# Patient Record
Sex: Female | Born: 1975 | Race: White | Hispanic: No | Marital: Married | Smoking: Former smoker
Health system: Southern US, Community
[De-identification: ages and names within clinical notes are randomized; demographics above are authoritative.]

## PROBLEM LIST (undated history)

## (undated) DIAGNOSIS — Z98891 History of uterine scar from previous surgery: Secondary | ICD-10-CM

## (undated) DIAGNOSIS — Z9851 Tubal ligation status: Secondary | ICD-10-CM

## (undated) DIAGNOSIS — Z3480 Encounter for supervision of other normal pregnancy, unspecified trimester: Secondary | ICD-10-CM

## (undated) DIAGNOSIS — E039 Hypothyroidism, unspecified: Secondary | ICD-10-CM

## (undated) HISTORY — PX: DIAGNOSTIC LAPAROSCOPY: SUR761

---

## 2006-11-28 ENCOUNTER — Ambulatory Visit (HOSPITAL_COMMUNITY): Payer: Self-pay | Admitting: Psychiatry

## 2006-12-28 ENCOUNTER — Ambulatory Visit (HOSPITAL_COMMUNITY): Payer: Self-pay | Admitting: Psychiatry

## 2007-02-01 ENCOUNTER — Ambulatory Visit (HOSPITAL_COMMUNITY): Payer: Self-pay | Admitting: Psychiatry

## 2007-04-26 ENCOUNTER — Ambulatory Visit (HOSPITAL_COMMUNITY): Payer: Self-pay | Admitting: Psychiatry

## 2007-07-26 ENCOUNTER — Ambulatory Visit (HOSPITAL_COMMUNITY): Payer: Self-pay | Admitting: Psychiatry

## 2007-08-06 ENCOUNTER — Ambulatory Visit (HOSPITAL_COMMUNITY): Payer: Self-pay | Admitting: Psychiatry

## 2007-09-02 ENCOUNTER — Ambulatory Visit (HOSPITAL_COMMUNITY): Payer: Self-pay | Admitting: Psychiatry

## 2007-09-19 ENCOUNTER — Ambulatory Visit (HOSPITAL_COMMUNITY): Payer: Self-pay | Admitting: Psychiatry

## 2007-10-02 ENCOUNTER — Ambulatory Visit (HOSPITAL_COMMUNITY): Payer: Self-pay | Admitting: Psychiatry

## 2007-11-14 ENCOUNTER — Ambulatory Visit (HOSPITAL_COMMUNITY): Payer: Self-pay | Admitting: Psychiatry

## 2007-11-19 ENCOUNTER — Ambulatory Visit (HOSPITAL_COMMUNITY): Payer: Self-pay | Admitting: Psychiatry

## 2007-12-10 ENCOUNTER — Ambulatory Visit (HOSPITAL_COMMUNITY): Payer: Self-pay | Admitting: Psychiatry

## 2007-12-31 ENCOUNTER — Ambulatory Visit (HOSPITAL_COMMUNITY): Payer: Self-pay | Admitting: Psychology

## 2008-01-15 ENCOUNTER — Ambulatory Visit (HOSPITAL_COMMUNITY): Payer: Self-pay | Admitting: Psychiatry

## 2008-02-25 ENCOUNTER — Ambulatory Visit (HOSPITAL_COMMUNITY): Payer: Self-pay | Admitting: Psychiatry

## 2008-03-26 ENCOUNTER — Ambulatory Visit (HOSPITAL_COMMUNITY): Payer: Self-pay | Admitting: Psychiatry

## 2008-04-24 ENCOUNTER — Ambulatory Visit (HOSPITAL_COMMUNITY): Payer: Self-pay | Admitting: Psychiatry

## 2011-11-07 NOTE — L&D Delivery Note (Signed)
Delivery Note   Requested by Dr. Ellyn Hack to attend this repeat C-section delivery at 39 [redacted] weeks GA.   The mother is a G3P2  O pos, GBS neg.  Pregnancy uncomplicated.  ROM at delivery with clear fluid.   Infant vigorous with good spontaneous cry.  Routine NRP followed including warming, drying and stimulation.  Apgars 9 / 9.  Physical exam within normal limits / notable for .   Left in OR for skin-to-skin contact with mother, in care of CN staff.  John Giovanni, DO  Neonatologist

## 2012-04-09 LAB — OB RESULTS CONSOLE HIV ANTIBODY (ROUTINE TESTING): HIV: NONREACTIVE

## 2012-04-09 LAB — OB RESULTS CONSOLE ABO/RH: RH Type: POSITIVE

## 2012-04-09 LAB — OB RESULTS CONSOLE RPR: RPR: NONREACTIVE

## 2012-04-09 LAB — OB RESULTS CONSOLE HEPATITIS B SURFACE ANTIGEN: Hepatitis B Surface Ag: NEGATIVE

## 2012-10-15 ENCOUNTER — Encounter (HOSPITAL_COMMUNITY): Payer: Self-pay

## 2012-10-17 ENCOUNTER — Encounter (HOSPITAL_COMMUNITY)
Admission: RE | Admit: 2012-10-17 | Discharge: 2012-10-17 | Disposition: A | Discharge status: Home / Self Care | Payer: BC Managed Care – PPO | Source: Ambulatory Visit | Attending: Obstetrics and Gynecology | Admitting: Obstetrics and Gynecology

## 2012-10-17 ENCOUNTER — Encounter (HOSPITAL_COMMUNITY): Payer: Self-pay

## 2012-10-17 HISTORY — DX: Hypothyroidism, unspecified: E03.9

## 2012-10-17 LAB — SURGICAL PCR SCREEN: MRSA, PCR: NEGATIVE

## 2012-10-17 LAB — CBC
HCT: 39.7 % (ref 36.0–46.0)
Hemoglobin: 13 g/dL (ref 12.0–15.0)
MCH: 30.4 pg (ref 26.0–34.0)
MCHC: 32.7 g/dL (ref 30.0–36.0)
RDW: 14.8 % (ref 11.5–15.5)

## 2012-10-17 NOTE — Patient Instructions (Addendum)
[name]  10/17/2012   Your procedure is scheduled on:  10/22/12  Enter through the Main Entrance of [facility] at 6 AM.  Pick up the phone at the desk and dial [phone].   Call this number if you have problems the morning of surgery: [phone]   Remember:   Do not eat food:After Midnight.  Do not drink clear liquids: After Midnight.  Take these medicines the morning of surgery with A SIP OF WATER: Thyroid medication   Do not wear jewelry, make-up or nail polish.  Do not wear lotions, powders, or perfumes. You may wear deodorant.  Do not shave 48 hours prior to surgery.  Do not bring valuables to the hospital.  Contacts, dentures or bridgework may not be worn into surgery.  Leave suitcase in the car. After surgery it may be brought to your room.  For patients admitted to the hospital, checkout time is 11:00 AM the day of discharge.   Patients discharged the day of surgery will not be allowed to drive home.  Name and phone number of your driver: NA  Special Instructions: Shower using CHG 2 nights before surgery and the night before surgery.  If you shower the day of surgery use CHG.  Use special wash - you have one bottle of CHG for all showers.  You should use approximately 1/3 of the bottle for each shower.   Please read over the following fact sheets that you were given: MRSA Information

## 2012-10-21 ENCOUNTER — Encounter (HOSPITAL_COMMUNITY): Payer: Self-pay | Admitting: Pharmacist

## 2012-10-21 NOTE — H&P (Signed)
Bryanna Yim is a 36 y.o. female G3P2002 at 39+ for rLTCS/BTL.  Pt has h/o VBAC and LTCS, desires rLTCS and BTL for undesired future fertility.  +FM< no LOF, no VB, occ ctx.  Uncomplicated PNC.  D/w pt r/b/a of rLTCS and BTL. Maternal Medical History:  Contractions: Frequency: irregular.    Fetal activity: Perceived fetal activity is normal.   Last perceived fetal movement was within the past hour.      OB History    Grav Para Term Preterm Abortions TAB SAB Ect Mult Living   3 2 2       2     G1 LTCS, FTP 9#5 G2 VBAC, Forceps 8#5 G3 present  No STDs Past Medical History  Diagnosis Date  . Hypothyroidism   GAD/depression Past Surgical History  Procedure Date  . Cesarean section   . Diagnostic laparoscopy    Family History: DM, HTN, Kidney disease, nervous d/o Social History:  does not have a smoking history on file. She does not have any smokeless tobacco history on file. Her alcohol and drug histories not on file. married Meds PNV, Synthroid All PCN - SOB   Prenatal Transfer Tool  Maternal Diabetes: No Genetic Screening: Normal Maternal Ultrasounds/Referrals: Normal Fetal Ultrasounds or other Referrals:  None Maternal Substance Abuse:  No Significant Maternal Medications:  None Significant Maternal Lab Results:  Lab values include: Group B Strep negative Other Comments:  None  Review of Systems  Constitutional: Negative.   HENT: Negative.   Eyes: Negative.   Respiratory: Negative.   Cardiovascular: Negative.   Gastrointestinal: Negative.   Genitourinary: Negative.   Musculoskeletal: Negative.   Skin: Negative.   Neurological: Negative.   Psychiatric/Behavioral: Negative.       There were no vitals taken for this visit. Maternal Exam:  Abdomen: Surgical scars: low transverse.   Fundal height is appropriate for gestation.   Estimated fetal weight is 7.5 - 8.5 #.   Fetal presentation: vertex  Introitus: Normal vulva. Normal vagina.  Pelvis: adequate for  delivery.   Cervix: Cervix evaluated by digital exam.     Physical Exam  Constitutional: She is oriented to person, place, and time. She appears well-developed and well-nourished.  HENT:  Head: Normocephalic and atraumatic.  Eyes: Pupils are equal, round, and reactive to light.  Neck: Normal range of motion. Neck supple.  Cardiovascular: Normal rate and regular rhythm.   Respiratory: Effort normal and breath sounds normal. No respiratory distress.  GI: Soft. Bowel sounds are normal. There is no tenderness.  Musculoskeletal: Normal range of motion.  Neurological: She is alert and oriented to person, place, and time.  Skin: Skin is warm and dry.  Psychiatric: She has a normal mood and affect. Her behavior is normal.    Prenatal labs: ABO, Rh: O/Positive/-- (06/04 0000) Antibody: Negative (06/04 0000) Rubella: Immune (06/04 0000) RPR: NON REACTIVE (12/12 1222)  HBsAg: Negative (06/04 0000)  HIV: Non-reactive (06/04 0000)  GBS:   negative Hgb 14.5/ Pap WNL HR HPV neg/ Ur Cx neg/ Plt 195K/ GC neg/ Chl neg/ CF neg/ First Tri Scr WNL/ TSH WNL/ AFP WNL/ glucola WNL  Dated by first tri [place], [facility] 12/22 Anatomy scan - nl anat, ant plac, female  Tdap/Flu 08/02/12  Assessment/Plan: 36yo [id number] at 39+ for rLTCS with undesired fertility also for BTL.  D/w pt r/b/a - will proceed   [name],Caylen Yardley 10/21/2012, 9:23 PM

## 2012-10-22 ENCOUNTER — Encounter (HOSPITAL_COMMUNITY): Payer: Self-pay | Admitting: Anesthesiology

## 2012-10-22 ENCOUNTER — Encounter (HOSPITAL_COMMUNITY): Payer: Self-pay | Admitting: Obstetrics and Gynecology

## 2012-10-22 ENCOUNTER — Inpatient Hospital Stay (HOSPITAL_COMMUNITY)
Admission: AD | Admit: 2012-10-22 | Discharge: 2012-10-24 | DRG: 371 | Disposition: A | Discharge status: Home / Self Care | Payer: BC Managed Care – PPO | Source: Ambulatory Visit | Attending: Obstetrics and Gynecology | Admitting: Obstetrics and Gynecology

## 2012-10-22 ENCOUNTER — Inpatient Hospital Stay (HOSPITAL_COMMUNITY): Payer: BC Managed Care – PPO | Admitting: Anesthesiology

## 2012-10-22 ENCOUNTER — Encounter (HOSPITAL_COMMUNITY)
Admission: AD | Disposition: A | Discharge status: Home / Self Care | Payer: Self-pay | Source: Ambulatory Visit | Attending: Obstetrics and Gynecology

## 2012-10-22 DIAGNOSIS — O09529 Supervision of elderly multigravida, unspecified trimester: Secondary | ICD-10-CM | POA: Diagnosis present

## 2012-10-22 DIAGNOSIS — E039 Hypothyroidism, unspecified: Secondary | ICD-10-CM | POA: Diagnosis present

## 2012-10-22 DIAGNOSIS — Z9851 Tubal ligation status: Secondary | ICD-10-CM

## 2012-10-22 DIAGNOSIS — E079 Disorder of thyroid, unspecified: Secondary | ICD-10-CM | POA: Diagnosis present

## 2012-10-22 DIAGNOSIS — Z01818 Encounter for other preprocedural examination: Secondary | ICD-10-CM

## 2012-10-22 DIAGNOSIS — O34219 Maternal care for unspecified type scar from previous cesarean delivery: Principal | ICD-10-CM | POA: Diagnosis present

## 2012-10-22 DIAGNOSIS — Z3480 Encounter for supervision of other normal pregnancy, unspecified trimester: Secondary | ICD-10-CM

## 2012-10-22 DIAGNOSIS — Z302 Encounter for sterilization: Secondary | ICD-10-CM

## 2012-10-22 DIAGNOSIS — Z98891 History of uterine scar from previous surgery: Secondary | ICD-10-CM

## 2012-10-22 DIAGNOSIS — Z01812 Encounter for preprocedural laboratory examination: Secondary | ICD-10-CM

## 2012-10-22 HISTORY — DX: History of uterine scar from previous surgery: Z98.891

## 2012-10-22 HISTORY — DX: Encounter for supervision of other normal pregnancy, unspecified trimester: Z34.80

## 2012-10-22 HISTORY — DX: Tubal ligation status: Z98.51

## 2012-10-22 LAB — CBC
HCT: 38.5 % (ref 36.0–46.0)
MCV: 91.7 fL (ref 78.0–100.0)
Platelets: 136 10*3/uL — ABNORMAL LOW (ref 150–400)
RBC: 4.2 MIL/uL (ref 3.87–5.11)
RDW: 15 % (ref 11.5–15.5)
WBC: 12.7 10*3/uL — ABNORMAL HIGH (ref 4.0–10.5)

## 2012-10-22 SURGERY — Surgical Case
Anesthesia: Spinal | Site: Abdomen | Laterality: Bilateral | Wound class: Clean Contaminated

## 2012-10-22 MED ORDER — SIMETHICONE 80 MG PO CHEW
80.0000 mg | CHEWABLE_TABLET | Freq: Three times a day (TID) | ORAL | Status: DC
  Administered 2012-10-22 – 2012-10-24 (×7): 80 mg via ORAL

## 2012-10-22 MED ORDER — SIMETHICONE 80 MG PO CHEW: 80.0000 mg | CHEWABLE_TABLET | ORAL | Status: DC | PRN

## 2012-10-22 MED ORDER — PHENYLEPHRINE HCL 10 MG/ML IJ SOLN
INTRAMUSCULAR | Status: DC | PRN
  Administered 2012-10-22: 40 ug via INTRAVENOUS
  Administered 2012-10-22: 80 ug via INTRAVENOUS
  Administered 2012-10-22 (×4): 40 ug via INTRAVENOUS

## 2012-10-22 MED ORDER — GENTAMICIN SULFATE 40 MG/ML IJ SOLN
INTRAVENOUS | Status: AC
  Administered 2012-10-22: 100 mL via INTRAVENOUS
  Filled 2012-10-22: qty 7.25

## 2012-10-22 MED ORDER — MORPHINE SULFATE (PF) 0.5 MG/ML IJ SOLN
INTRAMUSCULAR | Status: DC | PRN
  Administered 2012-10-22: .1 mg via INTRATHECAL

## 2012-10-22 MED ORDER — NALBUPHINE SYRINGE 5 MG/0.5 ML
5.0000 mg | INJECTION | INTRAMUSCULAR | Status: DC | PRN
  Filled 2012-10-22: qty 1

## 2012-10-22 MED ORDER — OXYTOCIN 10 UNIT/ML IJ SOLN
INTRAMUSCULAR | Status: AC
  Filled 2012-10-22: qty 4

## 2012-10-22 MED ORDER — ONDANSETRON HCL 4 MG/2ML IJ SOLN
INTRAMUSCULAR | Status: DC | PRN
  Administered 2012-10-22: 4 mg via INTRAVENOUS

## 2012-10-22 MED ORDER — DIPHENHYDRAMINE HCL 25 MG PO CAPS
25.0000 mg | ORAL_CAPSULE | ORAL | Status: DC | PRN
Start: 2012-10-22 — End: 2012-10-24

## 2012-10-22 MED ORDER — DIBUCAINE 1 % RE OINT: 1.0000 "application " | TOPICAL_OINTMENT | RECTAL | Status: DC | PRN

## 2012-10-22 MED ORDER — LEVOTHYROXINE SODIUM 75 MCG PO TABS
75.0000 ug | ORAL_TABLET | Freq: Every day | ORAL | Status: DC
  Administered 2012-10-23 – 2012-10-24 (×2): 75 ug via ORAL
  Filled 2012-10-22 (×2): qty 1

## 2012-10-22 MED ORDER — KETOROLAC TROMETHAMINE 30 MG/ML IJ SOLN
INTRAMUSCULAR | Status: AC
  Administered 2012-10-22: 30 mg via INTRAVENOUS
  Filled 2012-10-22: qty 1

## 2012-10-22 MED ORDER — MENTHOL 3 MG MT LOZG: 1.0000 | LOZENGE | OROMUCOSAL | Status: DC | PRN

## 2012-10-22 MED ORDER — ONDANSETRON HCL 4 MG/2ML IJ SOLN
INTRAMUSCULAR | Status: AC
  Filled 2012-10-22: qty 2

## 2012-10-22 MED ORDER — MORPHINE SULFATE 0.5 MG/ML IJ SOLN
INTRAMUSCULAR | Status: AC
  Filled 2012-10-22: qty 10

## 2012-10-22 MED ORDER — ZOLPIDEM TARTRATE 5 MG PO TABS: 5.0000 mg | ORAL_TABLET | Freq: Every evening | ORAL | Status: DC | PRN

## 2012-10-22 MED ORDER — LACTATED RINGERS IV SOLN
Freq: Once | INTRAVENOUS | Status: AC
  Administered 2012-10-22: 07:00:00 via INTRAVENOUS

## 2012-10-22 MED ORDER — ONDANSETRON HCL 4 MG/2ML IJ SOLN: 4.0000 mg | INTRAMUSCULAR | Status: DC | PRN

## 2012-10-22 MED ORDER — GENTAMICIN SULFATE 40 MG/ML IJ SOLN: INTRAVENOUS | Status: DC

## 2012-10-22 MED ORDER — SCOPOLAMINE 1 MG/3DAYS TD PT72
MEDICATED_PATCH | TRANSDERMAL | Status: AC
  Administered 2012-10-22: 1.5 mg via TRANSDERMAL
  Filled 2012-10-22: qty 1

## 2012-10-22 MED ORDER — ONDANSETRON HCL 4 MG/2ML IJ SOLN: 4.0000 mg | Freq: Three times a day (TID) | INTRAMUSCULAR | Status: DC | PRN

## 2012-10-22 MED ORDER — KETOROLAC TROMETHAMINE 30 MG/ML IJ SOLN
30.0000 mg | Freq: Four times a day (QID) | INTRAMUSCULAR | Status: AC | PRN
  Filled 2012-10-22: qty 1

## 2012-10-22 MED ORDER — SCOPOLAMINE 1 MG/3DAYS TD PT72
1.0000 | MEDICATED_PATCH | Freq: Once | TRANSDERMAL | Status: DC
  Filled 2012-10-22: qty 1

## 2012-10-22 MED ORDER — ONDANSETRON HCL 4 MG PO TABS
4.0000 mg | ORAL_TABLET | ORAL | Status: DC | PRN
  Administered 2012-10-24: 4 mg via ORAL
  Filled 2012-10-22: qty 1

## 2012-10-22 MED ORDER — SENNOSIDES-DOCUSATE SODIUM 8.6-50 MG PO TABS
2.0000 | ORAL_TABLET | Freq: Every day | ORAL | Status: DC
  Administered 2012-10-23 (×2): 2 via ORAL

## 2012-10-22 MED ORDER — LANOLIN HYDROUS EX OINT: 1.0000 "application " | TOPICAL_OINTMENT | CUTANEOUS | Status: DC | PRN

## 2012-10-22 MED ORDER — FENTANYL CITRATE 0.05 MG/ML IJ SOLN
INTRAMUSCULAR | Status: DC | PRN
  Administered 2012-10-22: 12.5 ug via INTRATHECAL

## 2012-10-22 MED ORDER — FENTANYL CITRATE 0.05 MG/ML IJ SOLN
INTRAMUSCULAR | Status: AC
  Filled 2012-10-22: qty 2

## 2012-10-22 MED ORDER — METOCLOPRAMIDE HCL 5 MG/ML IJ SOLN: 10.0000 mg | Freq: Three times a day (TID) | INTRAMUSCULAR | Status: DC | PRN

## 2012-10-22 MED ORDER — SCOPOLAMINE 1 MG/3DAYS TD PT72
1.0000 | MEDICATED_PATCH | Freq: Once | TRANSDERMAL | Status: DC
  Administered 2012-10-22: 1.5 mg via TRANSDERMAL

## 2012-10-22 MED ORDER — PRENATAL MULTIVITAMIN CH: 1.0000 | ORAL_TABLET | Freq: Every day | ORAL | Status: DC

## 2012-10-22 MED ORDER — MEPERIDINE HCL 25 MG/ML IJ SOLN: 6.2500 mg | INTRAMUSCULAR | Status: DC | PRN

## 2012-10-22 MED ORDER — SODIUM CHLORIDE 0.9 % IJ SOLN: 3.0000 mL | INTRAMUSCULAR | Status: DC | PRN

## 2012-10-22 MED ORDER — WITCH HAZEL-GLYCERIN EX PADS: 1.0000 "application " | MEDICATED_PAD | CUTANEOUS | Status: DC | PRN

## 2012-10-22 MED ORDER — NALOXONE HCL 1 MG/ML IJ SOLN
1.0000 ug/kg/h | INTRAVENOUS | Status: DC | PRN
  Filled 2012-10-22: qty 2

## 2012-10-22 MED ORDER — BUPIVACAINE IN DEXTROSE 0.75-8.25 % IT SOLN
INTRATHECAL | Status: DC | PRN
  Administered 2012-10-22: 1.4 mL via INTRATHECAL

## 2012-10-22 MED ORDER — PHENYLEPHRINE 40 MCG/ML (10ML) SYRINGE FOR IV PUSH (FOR BLOOD PRESSURE SUPPORT)
PREFILLED_SYRINGE | INTRAVENOUS | Status: AC
  Filled 2012-10-22: qty 10

## 2012-10-22 MED ORDER — PRENATAL MULTIVITAMIN CH
1.0000 | ORAL_TABLET | Freq: Every day | ORAL | Status: DC
  Administered 2012-10-22 – 2012-10-23 (×2): 1 via ORAL
  Filled 2012-10-22 (×2): qty 1

## 2012-10-22 MED ORDER — DIPHENHYDRAMINE HCL 50 MG/ML IJ SOLN: 12.5000 mg | INTRAMUSCULAR | Status: DC | PRN

## 2012-10-22 MED ORDER — POLYETHYLENE GLYCOL 3350 17 G PO PACK
17.0000 g | PACK | Freq: Every day | ORAL | Status: DC
  Administered 2012-10-23: 17 g via ORAL
  Filled 2012-10-22 (×3): qty 1

## 2012-10-22 MED ORDER — FENTANYL CITRATE 0.05 MG/ML IJ SOLN: 25.0000 ug | INTRAMUSCULAR | Status: DC | PRN

## 2012-10-22 MED ORDER — IBUPROFEN 800 MG PO TABS
800.0000 mg | ORAL_TABLET | Freq: Three times a day (TID) | ORAL | Status: DC
  Administered 2012-10-23 – 2012-10-24 (×4): 800 mg via ORAL
  Filled 2012-10-22 (×4): qty 1

## 2012-10-22 MED ORDER — KETOROLAC TROMETHAMINE 60 MG/2ML IM SOLN
60.0000 mg | Freq: Once | INTRAMUSCULAR | Status: AC | PRN
  Filled 2012-10-22: qty 2

## 2012-10-22 MED ORDER — DIPHENHYDRAMINE HCL 50 MG/ML IJ SOLN: 25.0000 mg | INTRAMUSCULAR | Status: DC | PRN

## 2012-10-22 MED ORDER — PHENYLEPHRINE 40 MCG/ML (10ML) SYRINGE FOR IV PUSH (FOR BLOOD PRESSURE SUPPORT)
PREFILLED_SYRINGE | INTRAVENOUS | Status: AC
  Filled 2012-10-22: qty 5

## 2012-10-22 MED ORDER — OXYTOCIN 10 UNIT/ML IJ SOLN
40.0000 [IU] | INTRAVENOUS | Status: DC | PRN
  Administered 2012-10-22: 40 [IU] via INTRAVENOUS

## 2012-10-22 MED ORDER — OXYTOCIN 40 UNITS IN LACTATED RINGERS INFUSION - SIMPLE MED: 62.5000 mL/h | INTRAVENOUS | Status: AC

## 2012-10-22 MED ORDER — LACTATED RINGERS IV SOLN
INTRAVENOUS | Status: DC
  Administered 2012-10-22 (×4): via INTRAVENOUS

## 2012-10-22 MED ORDER — LACTATED RINGERS IV SOLN
INTRAVENOUS | Status: DC
  Administered 2012-10-22: 23:00:00 via INTRAVENOUS

## 2012-10-22 MED ORDER — KETOROLAC TROMETHAMINE 30 MG/ML IJ SOLN
30.0000 mg | Freq: Four times a day (QID) | INTRAMUSCULAR | Status: AC | PRN
  Administered 2012-10-22 (×3): 30 mg via INTRAVENOUS
  Filled 2012-10-22: qty 1

## 2012-10-22 MED ORDER — DIPHENHYDRAMINE HCL 25 MG PO CAPS: 25.0000 mg | ORAL_CAPSULE | Freq: Four times a day (QID) | ORAL | Status: DC | PRN

## 2012-10-22 MED ORDER — IBUPROFEN 600 MG PO TABS: 600.0000 mg | ORAL_TABLET | Freq: Four times a day (QID) | ORAL | Status: DC | PRN

## 2012-10-22 MED ORDER — NALOXONE HCL 0.4 MG/ML IJ SOLN: 0.4000 mg | INTRAMUSCULAR | Status: DC | PRN

## 2012-10-22 MED ORDER — OXYCODONE-ACETAMINOPHEN 5-325 MG PO TABS
1.0000 | ORAL_TABLET | ORAL | Status: DC | PRN
  Administered 2012-10-23 (×2): 2 via ORAL
  Administered 2012-10-23: 1 via ORAL
  Administered 2012-10-23 – 2012-10-24 (×2): 2 via ORAL
  Administered 2012-10-24: 1 via ORAL
  Filled 2012-10-22: qty 1
  Filled 2012-10-22 (×3): qty 2
  Filled 2012-10-22: qty 1
  Filled 2012-10-22: qty 2

## 2012-10-22 SURGICAL SUPPLY — 32 items
BENZOIN TINCTURE PRP APPL 2/3 (GAUZE/BANDAGES/DRESSINGS) ×2 IMPLANT
CLOTH BEACON ORANGE TIMEOUT ST (SAFETY) ×2 IMPLANT
CONTAINER PREFILL 10% NBF 15ML (MISCELLANEOUS) ×4 IMPLANT
DRAPE LG THREE QUARTER DISP (DRAPES) ×2 IMPLANT
DRSG OPSITE POSTOP 4X10 (GAUZE/BANDAGES/DRESSINGS) ×2 IMPLANT
DURAPREP 26ML APPLICATOR (WOUND CARE) ×2 IMPLANT
ELECT REM PT RETURN 9FT ADLT (ELECTROSURGICAL) ×2
ELECTRODE REM PT RTRN 9FT ADLT (ELECTROSURGICAL) ×1 IMPLANT
GLOVE BIO SURGEON STRL SZ 6.5 (GLOVE) ×2 IMPLANT
GLOVE BIO SURGEON STRL SZ7 (GLOVE) ×2 IMPLANT
GLOVE ORTHO TXT STRL SZ7.5 (GLOVE) ×2 IMPLANT
GOWN PREVENTION PLUS LG XLONG (DISPOSABLE) ×4 IMPLANT
GOWN PREVENTION PLUS XLARGE (GOWN DISPOSABLE) ×2 IMPLANT
NS IRRIG 1000ML POUR BTL (IV SOLUTION) ×2 IMPLANT
PACK C SECTION WH (CUSTOM PROCEDURE TRAY) ×2 IMPLANT
PAD OB MATERNITY 4.3X12.25 (PERSONAL CARE ITEMS) ×2 IMPLANT
RTRCTR C-SECT PINK 25CM LRG (MISCELLANEOUS) ×2 IMPLANT
STRIP CLOSURE SKIN 1/2X4 (GAUZE/BANDAGES/DRESSINGS) ×2 IMPLANT
SUT MNCRL 0 VIOLET CTX 36 (SUTURE) ×2 IMPLANT
SUT MONOCRYL 0 CTX 36 (SUTURE) ×2
SUT PLAIN 1 NONE 54 (SUTURE) ×4 IMPLANT
SUT PLAIN 2 0 XLH (SUTURE) ×2 IMPLANT
SUT VIC AB 0 CT1 27 (SUTURE) ×2
SUT VIC AB 0 CT1 27XBRD ANBCTR (SUTURE) ×2 IMPLANT
SUT VIC AB 2-0 CT1 27 (SUTURE) ×1
SUT VIC AB 2-0 CT1 TAPERPNT 27 (SUTURE) ×1 IMPLANT
SUT VIC AB 3-0 SH 27 (SUTURE) ×1
SUT VIC AB 3-0 SH 27X BRD (SUTURE) ×1 IMPLANT
SUT VIC AB 4-0 KS 27 (SUTURE) ×2 IMPLANT
SYR BULB IRRIGATION 50ML (SYRINGE) ×2 IMPLANT
TOWEL OR 17X24 6PK STRL BLUE (TOWEL DISPOSABLE) ×6 IMPLANT
TRAY FOLEY CATH 14FR (SET/KITS/TRAYS/PACK) ×2 IMPLANT

## 2012-10-22 NOTE — Anesthesia Postprocedure Evaluation (Signed)
  Anesthesia Post-op Note  Patient: Raven Ballard  Procedure(s) Performed: Procedure(s) (LRB) with comments: CESAREAN SECTION WITH BILATERAL TUBAL LIGATION (Bilateral)  Patient Location: PACU  Anesthesia Type:Spinal  Level of Consciousness: awake, alert  and oriented  Airway and Oxygen Therapy: Patient Spontanous Breathing  Post-op Pain: none  Post-op Assessment: Post-op Vital signs reviewed, Patient's Cardiovascular Status Stable, Respiratory Function Stable, Patent Airway, No signs of Nausea or vomiting, Pain level controlled, No headache and No backache  Post-op Vital Signs: Reviewed and stable  Complications: No apparent anesthesia complications

## 2012-10-22 NOTE — Anesthesia Postprocedure Evaluation (Signed)
Anesthesia Post Note  Patient: Raven Ballard  Procedure(s) Performed: Procedure(s) (LRB): CESAREAN SECTION WITH BILATERAL TUBAL LIGATION (Bilateral)  Anesthesia type: Spinal  Patient location: Mother/Baby  Post pain: Pain level controlled  Post assessment: Post-op Vital signs reviewed  Last Vitals:  Filed Vitals:   10/22/12 1800  BP: 104/56  Pulse: 67  Temp: 36.4 C  Resp: 18    Post vital signs: Reviewed  Level of consciousness: awake  Complications: No apparent anesthesia complications

## 2012-10-22 NOTE — Op Note (Signed)
NAME:  Raven Ballard, Raven Ballard NO.:  [id number]  MEDICAL RECORD NO.:  [id number]  LOCATION:  9126                          FACILITY:  [facility]  PHYSICIAN:  Sherron Monday, MD        DATE OF BIRTH:  1976/03/10  DATE OF PROCEDURE:  10/22/2012 DATE OF DISCHARGE:                              OPERATIVE REPORT   PREOPERATIVE DIAGNOSES:  Intrauterine pregnancy at term, history of low- transverse cesarean section, declines vaginal birth after cesarean section, undesired fertility.  POSTOPERATIVE DIAGNOSES:  Intrauterine pregnancy at term, history of low- transverse cesarean section, declines vaginal birth after cesarean section, undesired fertility, delivered.  PROCEDURE:  Repeat low transverse cesarean section with bilateral tubal ligation.  FINDINGS:  Viable female infant at 7:58 with Apgars of 9 at 1 minute, 9 at 5 minutes and weight pending at the time of dictation.  Normal uterus, tubes, and ovaries.  SURGEON:  Sherron Monday, MD  ASSISTANT:  Zenaida Niece, MD  ANESTHESIA:  Spinal.  EBL:  700 mL.  IV FLUIDS:  3200 mL.  URINE OUTPUT:  100 mL clear urine at the end of the procedure.  COMPLICATIONS:  None.  PATHOLOGY:  Bilateral tubal segments to Pathology.  Placenta to L and D.  PROCEDURE:  After informed consent was reviewed with the patient including risks, benefits, and alternatives of surgical procedure, she was transported to the OR, where spinal anesthesia was placed and found to be adequate.  She was then returned to supine position with a leftward tilt, prepped and draped in the normal sterile fashion.  A Pfannenstiel skin incision was made at the level of her previous incision, carried through the underlying layer of fascia sharply.  This was incised in the midline and the incision was extended laterally with Mayo scissors.  The inferior aspect of the fascial incision was grasped with Kocher clamps and elevated and the rectus muscles were dissected off  both bluntly and sharply.  Attention was then turned to the superior portion of fascial incision, which in a similar fashion was grasped with Kocher clamps and elevated and the rectus muscles were dissected off both bluntly and sharply.  Midline was easily identified.  Peritoneum was entered with the aid of hemostats.  Incision was extended superiorly and inferiorly with good visualization of the bladder.  The Alexis skin retractor was placed, carefully making sure no bowel was entrapped. Vesicouterine peritoneum was easily identified, tented up with smooth pickups and the bladder flap was created both digitally and sharply. Uterus was incised in transverse fashion and infant was delivered from vertex presentation.  Nose and mouth were suctioned on the field.  Cord was clamped and cut.  Infant was handed off to awaiting pediatric staff. Placenta was delivered and given to the cord blood collection.  Placenta was expressed from the uterus.  Uterus was cleared of all clot and debris.  The uterine incision was closed with 2 layers of 0 Monocryl with some bleeding at the left corner was reinforced with 0 Vicryl as well as 3-0 Vicryl.  The tubes were bilaterally identified, followed out to the fimbriated end and ligated in a modified Pomeroy fashion with 0 plain gut.  The intervening portion was excised and sent to Pathology. Bilaterally it was noted to be hemostatic and copious pelvic irrigation was performed.  The Alexis skin retractor was removed.  The peritoneum was reapproximated using 2-0 Vicryl in a running fashion.  The subcuticular adipose layer was made hemostatic with Bovie cautery after the fascia was closed with 0 Vicryl from either side overlapping in the midline.  The adipose layer of dead space was closed with plain gut. The skin was closed with 4-0 Vicryl with a Keith needle in subcuticular fashion.  Benzoin and Steri-Strips applied.  Sponge, lap, and needle counts were correct  x2 per the operating room staff.     Sherron Monday, MD     JB/MEDQ  D:  10/22/2012  T:  10/22/2012  Job:  [id number]

## 2012-10-22 NOTE — Addendum Note (Signed)
Addendum  created 10/22/12 1841 by Algis Greenhouse, CRNA   Modules edited:Notes Section

## 2012-10-22 NOTE — Transfer of Care (Signed)
Immediate Anesthesia Transfer of Care Note  Patient: Raven Ballard  Procedure(s) Performed: Procedure(s) (LRB) with comments: CESAREAN SECTION WITH BILATERAL TUBAL LIGATION (Bilateral)  Patient Location: PACU  Anesthesia Type:Spinal  Level of Consciousness: awake, alert  and oriented  Airway & Oxygen Therapy: Patient Spontanous Breathing  Post-op Assessment: Report given to PACU RN and Post -op Vital signs reviewed and stable  Post vital signs: stable  Complications: No apparent anesthesia complications

## 2012-10-22 NOTE — Interval H&P Note (Signed)
History and Physical Interval Note:  10/22/2012 7:19 AM  Raven Ballard  has presented today for surgery, with the diagnosis of Previous Cesarean Section  The various methods of treatment have been discussed with the patient and family. After consideration of risks, benefits and other options for treatment, the patient has consented to  Procedure(s) (LRB) with comments: CESAREAN SECTION WITH BILATERAL TUBAL LIGATION (Bilateral) as a surgical intervention .  The patient's history has been reviewed, patient examined, no change in status, stable for surgery.  I have reviewed the patient's chart and labs.  Questions were answered to the patient's satisfaction.     [name],Ataya Murdy

## 2012-10-22 NOTE — Brief Op Note (Signed)
10/22/2012  8:53 AM  PATIENT:  Malon Kindle  36 y.o. female  PRE-OPERATIVE DIAGNOSIS:  Previous Cesarean Section  POST-OPERATIVE DIAGNOSIS:  Previous Cesarean Section  PROCEDURE:  Procedure(s) (LRB) with comments: CESAREAN SECTION WITH BILATERAL TUBAL LIGATION (Bilateral) viable female infant at 7:58, apgars 9/9, weight P, nl uterus, tubes, ovaries  SURGEON:  Surgeon(s) and Role:    * Sherron Monday, MD - Primary    * Lavina Hamman, MD - Assisting  ANESTHESIA:   spinal  EBL:  Total I/O In: 3200 [I.V.:3200] Out: 800 [Urine:100; Blood:700]  BLOOD ADMINISTERED:none  DRAINS: Urinary Catheter (Foley)   LOCAL MEDICATIONS USED:  NONE  SPECIMEN:  Source of Specimen:  Placenta, B tubes  DISPOSITION OF SPECIMEN: L&D and PATHOLOGY  COUNTS:  YES  TOURNIQUET:  * No tourniquets in log *  DICTATION: .Other Dictation: Dictation Number [id number]  PLAN OF CARE: Admit to inpatient   PATIENT DISPOSITION:  PACU - hemodynamically stable.   Delay start of Pharmacological VTE agent (>24hrs) due to surgical blood loss or risk of bleeding: not applicable

## 2012-10-22 NOTE — Anesthesia Procedure Notes (Signed)
Spinal  Patient location during procedure: OR Start time: 10/22/2012 7:30 AM End time: 10/22/2012 7:35 AM Staffing Anesthesiologist: Sandrea Hughs Performed by: anesthesiologist  Preanesthetic Checklist Completed: patient identified, site marked, surgical consent, pre-op evaluation, timeout performed, IV checked, risks and benefits discussed and monitors and equipment checked Spinal Block Patient position: sitting Prep: DuraPrep Patient monitoring: heart rate, cardiac monitor, continuous pulse ox and blood pressure Approach: midline Location: L3-4 Injection technique: single-shot Needle Needle type: Sprotte  Needle gauge: 24 G Needle length: 9 cm Needle insertion depth: 7 cm Assessment Sensory level: T4

## 2012-10-22 NOTE — Anesthesia Preprocedure Evaluation (Signed)
Anesthesia Evaluation  Patient identified by MRN, date of birth, ID band Patient awake    Reviewed: Allergy & Precautions, H&P , NPO status , Patient's Chart, lab work & pertinent test results, reviewed documented beta blocker date and time   Airway Mallampati: III TM Distance: >3 FB Neck ROM: full    Dental  (+) Teeth Intact   Pulmonary neg pulmonary ROS,  breath sounds clear to auscultation        Cardiovascular negative cardio ROS  Rhythm:regular Rate:Normal     Neuro/Psych negative neurological ROS  negative psych ROS   GI/Hepatic negative GI ROS, Neg liver ROS,   Endo/Other  Hypothyroidism   Renal/GU negative Renal ROS  negative genitourinary   Musculoskeletal   Abdominal   Peds  Hematology negative hematology ROS (+)   Anesthesia Other Findings   Reproductive/Obstetrics (+) Pregnancy (h/o c/s x1)                           Anesthesia Physical Anesthesia Plan  ASA: II  Anesthesia Plan: Spinal   Post-op Pain Management:    Induction:   Airway Management Planned:   Additional Equipment:   Intra-op Plan:   Post-operative Plan:   Informed Consent: I have reviewed the patients History and Physical, chart, labs and discussed the procedure including the risks, benefits and alternatives for the proposed anesthesia with the patient or authorized representative who has indicated his/her understanding and acceptance.     Plan Discussed with: Surgeon and CRNA  Anesthesia Plan Comments:         Anesthesia Quick Evaluation

## 2012-10-23 ENCOUNTER — Encounter (HOSPITAL_COMMUNITY): Payer: Self-pay | Admitting: Obstetrics and Gynecology

## 2012-10-23 LAB — CBC
HCT: 31.1 % — ABNORMAL LOW (ref 36.0–46.0)
Hemoglobin: 10.2 g/dL — ABNORMAL LOW (ref 12.0–15.0)
MCHC: 32.8 g/dL (ref 30.0–36.0)
MCV: 92.6 fL (ref 78.0–100.0)
RDW: 15.1 % (ref 11.5–15.5)

## 2012-10-23 NOTE — Progress Notes (Signed)
Subjective: Postpartum Day 1: Cesarean Delivery Patient reports incisional pain and tolerating PO.  Nl lochia, pain controlled  Objective: Vital signs in last 24 hours: Temp:  [97.5 F (36.4 C)-99.6 F (37.6 C)] 99 F (37.2 C) (12/18 0530) Pulse Rate:  [66-101] 77  (12/18 0530) Resp:  [16-21] 20  (12/18 0530) BP: (94-113)/(53-76) 107/67 mmHg (12/18 0530) SpO2:  [95 %-100 %] 95 % (12/18 0530) Weight:  [81.647 kg (180 lb)] 81.647 kg (180 lb) (12/17 1104)  Physical Exam:  General: alert and no distress Lochia: appropriate Uterine Fundus: firm Incision: healing well DVT Evaluation: No evidence of DVT seen on physical exam.   Basename 10/23/12 0540 10/22/12 0605  HGB 10.2* 12.7  HCT 31.1* 38.5    Assessment/Plan: Status post Cesarean section. Doing well postoperatively.  Continue current care.  [name],Zlata Alcaide 10/23/2012, 7:11 AM

## 2012-10-24 MED ORDER — OXYCODONE-ACETAMINOPHEN 5-325 MG PO TABS
1.0000 | ORAL_TABLET | ORAL | Status: DC | PRN
Start: 2012-10-24 — End: 2013-04-01

## 2012-10-24 MED ORDER — PRENATAL MULTIVITAMIN CH: 1.0000 | ORAL_TABLET | Freq: Every day | ORAL | Status: DC

## 2012-10-24 MED ORDER — IBUPROFEN 800 MG PO TABS: 800.0000 mg | ORAL_TABLET | Freq: Three times a day (TID) | ORAL | Status: DC

## 2012-10-24 NOTE — Progress Notes (Addendum)
Subjective: Postpartum Day 2: Cesarean Delivery Patient reports incisional pain and tolerating PO. Nl lochia, pain controlled    Objective: Vital signs in last 24 hours: Temp:  [97.8 F (36.6 C)-98.5 F (36.9 C)] 97.8 F (36.6 C) (12/19 0535) Pulse Rate:  [72-89] 74  (12/19 0535) Resp:  [18] 18  (12/19 0535) BP: (111-131)/(75-76) 115/76 mmHg (12/19 0535)  Physical Exam:  General: alert and no distress Lochia: appropriate Uterine Fundus: firm Incision: healing well DVT Evaluation: No evidence of DVT seen on physical exam.   Basename 10/23/12 0540 10/22/12 0605  HGB 10.2* 12.7  HCT 31.1* 38.5    Assessment/Plan: Status post Cesarean section. Doing well postoperatively.  Continue current care.  Pt desires d/c home, cleared with nursery.  D/C with Motrin/percocet/pnv; f/u 2 weeks  [name],Benedicto Capozzi 10/24/2012, 8:46 AM

## 2012-10-24 NOTE — Discharge Summary (Addendum)
Obstetric Discharge Summary Reason for Admission: induction of labor Prenatal Procedures: none Intrapartum Procedures: cesarean: low cervical, transverse, BTL Postpartum Procedures: none Complications-Operative and Postpartum: none Hemoglobin  Date Value Range Status  10/23/2012 10.2* 12.0 - 15.0 g/dL Final     DELTA CHECK NOTED     REPEATED TO VERIFY     HCT  Date Value Range Status  10/23/2012 31.1* 36.0 - 46.0 % Final    Physical Exam:  General: alert and no distress Lochia: appropriate Uterine Fundus: firm Incision: healing well DVT Evaluation: No evidence of DVT seen on physical exam.  Discharge Diagnoses: Term Pregnancy-delivered  Discharge Information: Date: 10/24/2012 Activity: pelvic rest Diet: routine Medications: PNV, Ibuprofen and Percocet Condition: stable Instructions: refer to practice specific booklet Discharge to: home Follow-up Information    Follow up with [name],[name], MD. Schedule an appointment as soon as possible for a visit in 2 weeks.   Contact information:   [address] [place] [address] [phone]          Newborn Data: Live born female  Birth Weight: 7 lb 10.4 oz (3470 g) APGAR: 9, 9  Home with mother.  [name],[name] 10/24/2012, 9:09 AM

## 2012-11-01 ENCOUNTER — Telehealth (HOSPITAL_COMMUNITY): Payer: Self-pay | Admitting: *Deleted

## 2012-11-01 NOTE — Telephone Encounter (Signed)
Resolve episode 

## 2013-04-01 ENCOUNTER — Emergency Department (HOSPITAL_COMMUNITY): Payer: BC Managed Care – PPO

## 2013-04-01 ENCOUNTER — Emergency Department (HOSPITAL_COMMUNITY)
Admission: EM | Admit: 2013-04-01 | Discharge: 2013-04-01 | Disposition: A | Discharge status: Home / Self Care | Payer: BC Managed Care – PPO | Attending: Emergency Medicine | Admitting: Emergency Medicine

## 2013-04-01 ENCOUNTER — Encounter (HOSPITAL_COMMUNITY): Payer: Self-pay | Admitting: Emergency Medicine

## 2013-04-01 DIAGNOSIS — Z9851 Tubal ligation status: Secondary | ICD-10-CM | POA: Insufficient documentation

## 2013-04-01 DIAGNOSIS — Z79899 Other long term (current) drug therapy: Secondary | ICD-10-CM | POA: Insufficient documentation

## 2013-04-01 DIAGNOSIS — R350 Frequency of micturition: Secondary | ICD-10-CM | POA: Insufficient documentation

## 2013-04-01 DIAGNOSIS — N390 Urinary tract infection, site not specified: Secondary | ICD-10-CM

## 2013-04-01 DIAGNOSIS — R3915 Urgency of urination: Secondary | ICD-10-CM | POA: Insufficient documentation

## 2013-04-01 DIAGNOSIS — R339 Retention of urine, unspecified: Secondary | ICD-10-CM | POA: Insufficient documentation

## 2013-04-01 DIAGNOSIS — Z880 Allergy status to penicillin: Secondary | ICD-10-CM | POA: Insufficient documentation

## 2013-04-01 DIAGNOSIS — M549 Dorsalgia, unspecified: Secondary | ICD-10-CM | POA: Insufficient documentation

## 2013-04-01 DIAGNOSIS — E039 Hypothyroidism, unspecified: Secondary | ICD-10-CM | POA: Insufficient documentation

## 2013-04-01 DIAGNOSIS — R112 Nausea with vomiting, unspecified: Secondary | ICD-10-CM | POA: Insufficient documentation

## 2013-04-01 DIAGNOSIS — Z3202 Encounter for pregnancy test, result negative: Secondary | ICD-10-CM | POA: Insufficient documentation

## 2013-04-01 LAB — CBC WITH DIFFERENTIAL/PLATELET
Basophils Absolute: 0 10*3/uL (ref 0.0–0.1)
Basophils Relative: 0 % (ref 0–1)
Eosinophils Absolute: 0.1 10*3/uL (ref 0.0–0.7)
HCT: 43.5 % (ref 36.0–46.0)
Hemoglobin: 15.4 g/dL — ABNORMAL HIGH (ref 12.0–15.0)
Lymphs Abs: 1.6 10*3/uL (ref 0.7–4.0)
MCH: 32 pg (ref 26.0–34.0)
MCHC: 35.4 g/dL (ref 30.0–36.0)
MCV: 90.2 fL (ref 78.0–100.0)
Neutro Abs: 7.4 10*3/uL (ref 1.7–7.7)
RDW: 13.7 % (ref 11.5–15.5)

## 2013-04-01 LAB — URINALYSIS, ROUTINE W REFLEX MICROSCOPIC
Bilirubin Urine: NEGATIVE
Ketones, ur: 15 mg/dL — AB
Nitrite: POSITIVE — AB
pH: 7.5 (ref 5.0–8.0)

## 2013-04-01 LAB — BASIC METABOLIC PANEL
BUN: 8 mg/dL (ref 6–23)
Creatinine, Ser: 0.7 mg/dL (ref 0.50–1.10)
GFR calc Af Amer: >90 mL/min (ref >90)
GFR calc non Af Amer: >90 mL/min (ref >90)
Glucose, Bld: 94 mg/dL (ref 70–99)

## 2013-04-01 LAB — URINE MICROSCOPIC-ADD ON

## 2013-04-01 MED ORDER — MORPHINE SULFATE 4 MG/ML IJ SOLN
4.0000 mg | Freq: Once | INTRAMUSCULAR | Status: AC
  Administered 2013-04-01: 4 mg via INTRAVENOUS
  Filled 2013-04-01: qty 1

## 2013-04-01 MED ORDER — DEXTROSE 5 % IV SOLN
1.0000 g | Freq: Once | INTRAVENOUS | Status: AC
  Administered 2013-04-01: 1 g via INTRAVENOUS
  Filled 2013-04-01: qty 10

## 2013-04-01 MED ORDER — PHENAZOPYRIDINE HCL 200 MG PO TABS: 200.0000 mg | ORAL_TABLET | Freq: Three times a day (TID) | ORAL | Status: DC | PRN

## 2013-04-01 MED ORDER — CIPROFLOXACIN HCL 500 MG PO TABS: 500.0000 mg | ORAL_TABLET | Freq: Two times a day (BID) | ORAL | Status: DC

## 2013-04-01 MED ORDER — HYDROCODONE-ACETAMINOPHEN 5-325 MG PO TABS: 2.0000 | ORAL_TABLET | ORAL | Status: DC | PRN

## 2013-04-01 MED ORDER — ONDANSETRON HCL 4 MG/2ML IJ SOLN
4.0000 mg | Freq: Once | INTRAMUSCULAR | Status: AC
  Administered 2013-04-01: 4 mg via INTRAVENOUS
  Filled 2013-04-01: qty 2

## 2013-04-01 MED ORDER — SODIUM CHLORIDE 0.9 % IV SOLN
INTRAVENOUS | Status: DC
  Administered 2013-04-01: 10:00:00 via INTRAVENOUS

## 2013-04-01 NOTE — ED Notes (Signed)
Pt c/o of of left sided flank pain and urinary frequency. States that pain has become more intense since yesterdays. Hx of kidney stones. Pain 10/10.

## 2013-04-01 NOTE — ED Provider Notes (Signed)
History     CSN: [id number]  Arrival date & time 04/01/13  4098   First MD Initiated Contact with Patient 04/01/13 [phone]      Chief Complaint  Patient presents with  . Urinary Retention  . Flank Pain    (Consider location/radiation/quality/duration/timing/severity/associated sxs/prior treatment) HPI Comments: Patient presents to the ER for evaluation of left flank pain. Patient reports that symptoms began yesterday, however significantly worsened today. Patient has had urinary frequency and urgency. She has a history of frequent urinary tract infections, but has had a kidney stone before. She thinks this might be more like a kidney stone for her. Patient has had nausea and vomiting this morning associated with the pain. Pain is continuous, sharp and severe now. She has not had any fever.  Patient is a 37 y.o. female presenting with flank pain.  Flank Pain    Past Medical History  Diagnosis Date  . Hypothyroidism   . Normal pregnancy, repeat 10/22/2012  . S/P cesarean section 10/22/2012  . S/P tubal ligation 10/22/2012    Past Surgical History  Procedure Laterality Date  . Cesarean section    . Diagnostic laparoscopy    . Cesarean section with bilateral tubal ligation  10/22/2012    Procedure: CESAREAN SECTION WITH BILATERAL TUBAL LIGATION;  Surgeon: Sherron Monday, MD;  Location: [facility];  Service: Obstetrics;  Laterality: Bilateral;    No family history on file.  History  Substance Use Topics  . Smoking status: Not on file  . Smokeless tobacco: Not on file  . Alcohol Use:     OB History   Grav Para Term Preterm Abortions TAB SAB Ect Mult Living   3 3 3       3       Review of Systems  Genitourinary: Positive for urgency and flank pain.  Musculoskeletal: Positive for back pain.  All other systems reviewed and are negative.    Allergies  Penicillins  Home Medications   Current Outpatient Rx  Name  Route  Sig  Dispense  Refill  . acetaminophen (TYLENOL) 325  MG tablet   Oral   Take 650 mg by mouth every 6 (six) hours as needed. For pain/headache         . doxylamine, Sleep, (UNISOM) 25 MG tablet   Oral   Take 25 mg by mouth at bedtime.         Marland Kitchen ibuprofen (ADVIL,MOTRIN) 800 MG tablet   Oral   Take 1 tablet (800 mg total) by mouth every 8 (eight) hours.   40 tablet   1   . levothyroxine (SYNTHROID, LEVOTHROID) 75 MCG tablet   Oral   Take 75 mcg by mouth daily.         Marland Kitchen oxyCODONE-acetaminophen (PERCOCET/ROXICET) 5-325 MG per tablet   Oral   Take 1-2 tablets by mouth every 4 (four) hours as needed (moderate - severe pain).   40 tablet   0   . polyethylene glycol (MIRALAX / GLYCOLAX) packet   Oral   Take 17 g by mouth daily.         . Prenatal Vit-Fe Fumarate-FA (PRENATAL MULTIVITAMIN) TABS   Oral   Take 1 tablet by mouth daily.         . Prenatal Vit-Fe Fumarate-FA (PRENATAL MULTIVITAMIN) TABS   Oral   Take 1 tablet by mouth daily.   30 tablet   2     BP 130/89  Pulse 60  Temp(Src) 98.3 F (36.8 C) (Oral)  Resp 16  Wt 148 lb 5 oz (67.274 kg)  BMI 28.04 kg/m2  SpO2 98%  Physical Exam  Constitutional: She is oriented to person, place, and time. She appears well-developed and well-nourished. No distress.  HENT:  Head: Normocephalic and atraumatic.  Right Ear: Hearing normal.  Left Ear: Hearing normal.  Nose: Nose normal.  Mouth/Throat: Oropharynx is clear and moist and mucous membranes are normal.  Eyes: Conjunctivae and EOM are normal. Pupils are equal, round, and reactive to light.  Neck: Normal range of motion. Neck supple.  Cardiovascular: Regular rhythm, S1 normal and S2 normal.  Exam reveals no gallop and no friction rub.   No murmur heard. Pulmonary/Chest: Effort normal and breath sounds normal. No respiratory distress. She exhibits no tenderness.  Abdominal: Soft. Normal appearance and bowel sounds are normal. There is no hepatosplenomegaly. There is no tenderness. There is no rebound, no  guarding, no tenderness at McBurney's point and negative Murphy's sign. No hernia.  Musculoskeletal: Normal range of motion.  Neurological: She is alert and oriented to person, place, and time. She has normal strength. No cranial nerve deficit or sensory deficit. Coordination normal. GCS eye subscore is 4. GCS verbal subscore is 5. GCS motor subscore is 6.  Skin: Skin is warm, dry and intact. No rash noted. No cyanosis.  Psychiatric: She has a normal mood and affect. Her speech is normal and behavior is normal. Thought content normal.    ED Course  Procedures (including critical care time)  Labs Reviewed  CBC WITH DIFFERENTIAL - Abnormal; Notable for the following:    Hemoglobin 15.4 (*)    All other components within normal limits  URINALYSIS, ROUTINE W REFLEX MICROSCOPIC - Abnormal; Notable for the following:    Color, Urine AMBER (*)    APPearance CLOUDY (*)    Hgb urine dipstick LARGE (*)    Ketones, ur 15 (*)    Protein, ur 30 (*)    Nitrite POSITIVE (*)    Leukocytes, UA LARGE (*)    All other components within normal limits  URINE MICROSCOPIC-ADD ON - Abnormal; Notable for the following:    Squamous Epithelial / LPF MANY (*)    Bacteria, UA MANY (*)    All other components within normal limits  URINE CULTURE  BASIC METABOLIC PANEL  PREGNANCY, URINE   Ct Abdomen Pelvis Wo Contrast  04/01/2013   *RADIOLOGY REPORT*  Clinical Data: Left flank pain.  CT ABDOMEN AND PELVIS WITHOUT CONTRAST  Technique:  Multidetector CT imaging of the abdomen and pelvis was performed following the standard protocol without intravenous contrast.  Comparison: None.  Findings: Lung bases are clear.  No effusions.  Heart is normal size.  Liver, gallbladder, spleen, pancreas, adrenals have an unremarkable unenhanced appearance.  No renal or ureteral stones.  No hydronephrosis.  Urinary bladder decompressed, grossly unremarkable.  Uterus and adnexa have an unremarkable unenhanced appearance.  The appendix  is visualized and is normal. Bowel grossly unremarkable.  No free fluid, free air, or adenopathy.  No acute bony abnormality.  IMPRESSION: No acute findings.   Original Report Authenticated By: Charlett Nose, M.D.     Diagnosis: Urinary tract infection, possible early pyelonephritis    MDM  Patient comes to the ER for evaluation of urinary tract infection type symptoms. She is having more back and flank pain that she has had previous infections, is concerned about the possibility of a kidney stone which he has had in the past. Urinalysis did suggest infection, but I  opted to perform CAT scan to rule out concomitant urinary tract infection and kidney stone, which would be a much more serious condition. CAT scan did not show any ureterolithiasis. Patient treated with Rocephin here in the ER will continue antibiotic coverage as an outpatient.        Gilda Crease, MD 04/01/13 1215

## 2013-04-04 LAB — URINE CULTURE: Colony Count: >=100000

## 2013-04-06 ENCOUNTER — Telehealth (HOSPITAL_COMMUNITY): Payer: Self-pay | Admitting: Emergency Medicine

## 2013-04-06 NOTE — ED Notes (Signed)
Post ED Visit - Positive Culture Follow-up  Culture report reviewed by antimicrobial stewardship pharmacist: []  Wes Dulaney, Pharm.D., BCPS []  Celedonio Miyamoto, Pharm.D., BCPS []  Georgina Pillion, Pharm.D., BCPS []  [place], [address].D., BCPS, AAHIVP [x]  Estella Husk, Pharm.D., BCPS, AAHIVP  Positive urine culture Treated with Cipro, organism sensitive to the same and no further patient follow-up is required at this time.  Kylie A Holland 04/06/2013, 9:42 AM

## 2013-12-03 ENCOUNTER — Ambulatory Visit (INDEPENDENT_AMBULATORY_CARE_PROVIDER_SITE_OTHER): Payer: BC Managed Care – PPO | Admitting: Licensed Clinical Social Worker

## 2013-12-03 DIAGNOSIS — F3189 Other bipolar disorder: Secondary | ICD-10-CM

## 2013-12-15 ENCOUNTER — Encounter: Payer: Self-pay | Admitting: Emergency Medicine

## 2013-12-15 ENCOUNTER — Emergency Department
Admission: EM | Admit: 2013-12-15 | Discharge: 2013-12-15 | Disposition: A | Discharge status: Home / Self Care | Payer: BC Managed Care – PPO | Source: Home / Self Care | Attending: Family Medicine | Admitting: Family Medicine

## 2013-12-15 DIAGNOSIS — J111 Influenza due to unidentified influenza virus with other respiratory manifestations: Secondary | ICD-10-CM

## 2013-12-15 DIAGNOSIS — J029 Acute pharyngitis, unspecified: Secondary | ICD-10-CM

## 2013-12-15 DIAGNOSIS — R5383 Other fatigue: Secondary | ICD-10-CM

## 2013-12-15 DIAGNOSIS — R69 Illness, unspecified: Secondary | ICD-10-CM

## 2013-12-15 DIAGNOSIS — R5381 Other malaise: Secondary | ICD-10-CM

## 2013-12-15 LAB — POCT RAPID STREP A (OFFICE): Rapid Strep A Screen: NEGATIVE

## 2013-12-15 MED ORDER — OSELTAMIVIR PHOSPHATE 75 MG PO CAPS: 75.0000 mg | ORAL_CAPSULE | Freq: Two times a day (BID) | ORAL | Status: AC

## 2013-12-15 NOTE — ED Notes (Signed)
Raven Ballard c/o body aches, fatigue, chills, ear pain x yesterday. Rec'd flu vac this season.

## 2013-12-15 NOTE — Discharge Instructions (Signed)
If increasing cold-like symptoms develop: Begin plain Mucinex (1200 mg guaifenesin) twice daily for cough and congestion.  May add Sudafed for sinus congestion.   Increase fluid intake, rest. May use Afrin nasal spray (or generic oxymetazoline) twice daily for about 5 days.  Also recommend using saline nasal spray several times daily and saline nasal irrigation (AYR is a common brand) Try warm salt water gargles for sore throat.  Stop all antihistamines for now, and other non-prescription cough/cold preparations. May take Ibuprofen 200mg , 4 tabs every 8 hours with food for body aches, fever, etc. Follow-up with family doctor if not improving about one week.    Salt Water Gargle This solution will help make your mouth and throat feel better. HOME CARE INSTRUCTIONS   Mix 1 teaspoon of salt in 8 ounces of warm water.  Gargle with this solution as much or often as you need or as directed. Swish and gargle gently if you have any sores or wounds in your mouth.  Do not swallow this mixture. Document Released: 07/27/2004 Document Revised: 01/15/2012 Document Reviewed: 12/18/2008 [facility] Patient Information 2014 [place], [place].   Influenza, Adult Influenza ("the flu") is a viral infection of the respiratory tract. It occurs more often in winter months because people spend more time in close contact with one another. Influenza can make you feel very sick. Influenza easily spreads from person to person (contagious). CAUSES  Influenza is caused by a virus that infects the respiratory tract. You can catch the virus by breathing in droplets from an infected person's cough or sneeze. You can also catch the virus by touching something that was recently contaminated with the virus and then touching your mouth, nose, or eyes. SYMPTOMS  Symptoms typically last 4 to 10 days and may include:  Fever.  Chills.  Headache, body aches, and muscle aches.  Sore throat.  Chest discomfort and cough.  Poor  appetite.  Weakness or feeling tired.  Dizziness.  Nausea or vomiting. DIAGNOSIS  Diagnosis of influenza is often made based on your history and a physical exam. A nose or throat swab test can be done to confirm the diagnosis. RISKS AND COMPLICATIONS You may be at risk for a more severe case of influenza if you smoke cigarettes, have diabetes, have chronic heart disease (such as heart failure) or lung disease (such as asthma), or if you have a weakened immune system. Elderly people and pregnant women are also at risk for more serious infections. The most common complication of influenza is a lung infection (pneumonia). Sometimes, this complication can require emergency medical care and may be life-threatening. PREVENTION  An annual influenza vaccination (flu shot) is the best way to avoid getting influenza. An annual flu shot is now routinely recommended for all adults in the [place] TREATMENT  In mild cases, influenza goes away on its own. Treatment is directed at relieving symptoms. For more severe cases, your caregiver may prescribe antiviral medicines to shorten the sickness. Antibiotic medicines are not effective, because the infection is caused by a virus, not by bacteria. HOME CARE INSTRUCTIONS  Only take over-the-counter or prescription medicines for pain, discomfort, or fever as directed by your caregiver.  Use a cool mist humidifier to make breathing easier.  Get plenty of rest until your temperature returns to normal. This usually takes 3 to 4 days.  Drink enough fluids to keep your urine clear or pale yellow.  Cover your mouth and nose when coughing or sneezing, and wash your hands well to avoid  spreading the virus.  Stay home from work or school until your fever has been gone for at least 1 full day. SEEK MEDICAL CARE IF:   You have chest pain or a deep cough that worsens or produces more mucus.  You have nausea, vomiting, or diarrhea. SEEK IMMEDIATE MEDICAL CARE IF:    You have difficulty breathing, shortness of breath, or your skin or nails turn bluish.  You have severe neck pain or stiffness.  You have a severe headache, facial pain, or earache.  You have a worsening or recurring fever.  You have nausea or vomiting that cannot be controlled. MAKE SURE YOU:  Understand these instructions.  Will watch your condition.  Will get help right away if you are not doing well or get worse. Document Released: 10/20/2000 Document Revised: 04/23/2012 Document Reviewed: 01/22/2012 [facility] Patient Information 2014 [place].

## 2013-12-15 NOTE — ED Provider Notes (Signed)
CSN: [id number]     Arrival date & time 12/15/13  0807 History   First MD Initiated Contact with Patient 12/15/13 [phone]     Chief Complaint  Patient presents with  . Generalized Body Aches  . Otalgia      HPI Comments: Patient awoke yesterday fatigued.  She then developed myalgias, chills, headache, mild sore throat, and mild nasal congestion.  She has had tightness in her anterior chest.  The history is provided by the patient.    Past Medical History  Diagnosis Date  . Hypothyroidism   . Normal pregnancy, repeat 10/22/2012  . S/P cesarean section 10/22/2012  . S/P tubal ligation 10/22/2012   Past Surgical History  Procedure Laterality Date  . Cesarean section    . Diagnostic laparoscopy    . Cesarean section with bilateral tubal ligation  10/22/2012    Procedure: CESAREAN SECTION WITH BILATERAL TUBAL LIGATION;  Surgeon: Sherron MondayJody [name], MD;  Location: [facility];  Service: Obstetrics;  Laterality: Bilateral;   Family History  Problem Relation Age of Onset  . Hypertension Mother   . Thyroid disease Mother   . Diabetes Father    History  Substance Use Topics  . Smoking status: Current Every Day Smoker -- 0.50 packs/day    Types: Cigarettes  . Smokeless tobacco: Never Used  . Alcohol Use: No   OB History   Grav Para Term Preterm Abortions TAB SAB Ect Mult Living   3 3 3       3      Review of Systems + sore throat No cough No pleuritic pain No wheezing + mild nasal congestion ? post-nasal drainage No sinus pain/pressure No itchy/red eyes + earache No hemoptysis No SOB No fever, + chills + nausea No vomiting No abdominal pain No diarrhea No urinary symptoms No skin rash + fatigue + myalgias + headache Used OTC meds without relief  Allergies  Penicillins  Home Medications   Current Outpatient Rx  Name  Route  Sig  Dispense  Refill  . FLUoxetine (PROZAC) 20 MG tablet   Oral   Take 20 mg by mouth daily.         . clonazePAM (KLONOPIN) 1 MG tablet  Oral   Take 1 mg by mouth 2 (two) times daily as needed for anxiety.         Marland Kitchen. ibuprofen (ADVIL,MOTRIN) 200 MG tablet   Oral   Take 200 mg by mouth every 6 (six) hours as needed for pain (pain).         Marland Kitchen. levothyroxine (SYNTHROID, LEVOTHROID) 25 MCG tablet   Oral   Take 37.5 mcg by mouth daily before breakfast.         . oseltamivir (TAMIFLU) 75 MG capsule   Oral   Take 1 capsule (75 mg total) by mouth every 12 (twelve) hours.   10 capsule   0   . PRESCRIPTION MEDICATION      Pt states shes on a birth control 21 day         . traZODone (DESYREL) 50 MG tablet   Oral   Take 25 mg by mouth at bedtime.          BP 112/80  Pulse 58  Temp(Src) 98.1 F (36.7 C) (Oral)  Resp 14  Ht 5\' 1"  (1.549 m)  Wt 148 lb (67.132 kg)  BMI 27.98 kg/m2  SpO2 98%  LMP 12/09/2013 Physical Exam Nursing notes and Vital Signs reviewed. Appearance:  Patient appears healthy, stated  age, and in no acute distress Eyes:  Pupils are equal, round, and reactive to light and accomodation.  Extraocular movement is intact.  Conjunctivae are not inflamed  Ears:  Canals normal.  Tympanic membranes normal.  Nose:  Mildly congested turbinates.  No sinus tenderness.   Pharynx:  Minimal erythema. Neck:  Supple.  Tender shotty anterior nodes bilaterally.  Larger and tender posterior nodes are palpated bilaterally  Lungs:  Clear to auscultation.  Breath sounds are equal.  Heart:  Regular rate and rhythm without murmurs, rubs, or gallops.  Abdomen:  Nontender without masses or hepatosplenomegaly.  Bowel sounds are present.  No CVA or flank tenderness.  Extremities:  No edema.  No calf tenderness Skin:  No rash present.   ED Course  Procedures  none    Labs Reviewed  STREP A DNA PROBE  POCT RAPID STREP A (OFFICE) negative         MDM   1. Acute pharyngitis   2. Influenza-like illness    Throat culture pending Begin Tamiflu If increasing cold-like symptoms develop: Begin plain Mucinex  (1200 mg guaifenesin) twice daily for cough and congestion.  May add Sudafed for sinus congestion.   Increase fluid intake, rest. May use Afrin nasal spray (or generic oxymetazoline) twice daily for about 5 days.  Also recommend using saline nasal spray several times daily and saline nasal irrigation (AYR is a common brand) Try warm salt water gargles for sore throat.  Stop all antihistamines for now, and other non-prescription cough/cold preparations. May take Ibuprofen 200mg , 4 tabs every 8 hours with food for body aches, fever, etc. Follow-up with family doctor if not improving about one week.     Lattie Haw, MD 12/15/13 [phone]

## 2013-12-16 LAB — STREP A DNA PROBE: GASP: NEGATIVE

## 2013-12-24 ENCOUNTER — Ambulatory Visit (INDEPENDENT_AMBULATORY_CARE_PROVIDER_SITE_OTHER): Payer: BC Managed Care – PPO | Admitting: Licensed Clinical Social Worker

## 2013-12-24 DIAGNOSIS — F3189 Other bipolar disorder: Secondary | ICD-10-CM

## 2013-12-30 ENCOUNTER — Ambulatory Visit: Payer: BC Managed Care – PPO | Admitting: Licensed Clinical Social Worker

## 2014-01-07 ENCOUNTER — Ambulatory Visit: Payer: BC Managed Care – PPO | Admitting: Licensed Clinical Social Worker

## 2014-01-07 ENCOUNTER — Ambulatory Visit (INDEPENDENT_AMBULATORY_CARE_PROVIDER_SITE_OTHER): Payer: BC Managed Care – PPO | Admitting: Licensed Clinical Social Worker

## 2014-01-07 DIAGNOSIS — F3189 Other bipolar disorder: Secondary | ICD-10-CM

## 2014-01-21 ENCOUNTER — Ambulatory Visit (INDEPENDENT_AMBULATORY_CARE_PROVIDER_SITE_OTHER): Payer: BC Managed Care – PPO | Admitting: Licensed Clinical Social Worker

## 2014-01-21 DIAGNOSIS — F3189 Other bipolar disorder: Secondary | ICD-10-CM

## 2014-02-04 ENCOUNTER — Ambulatory Visit: Payer: BC Managed Care – PPO | Admitting: Licensed Clinical Social Worker

## 2014-02-25 ENCOUNTER — Ambulatory Visit: Payer: BC Managed Care – PPO | Admitting: Licensed Clinical Social Worker

## 2014-09-07 ENCOUNTER — Encounter: Payer: Self-pay | Admitting: Emergency Medicine

## 2017-06-25 ENCOUNTER — Other Ambulatory Visit: Payer: Self-pay | Admitting: Obstetrics and Gynecology

## 2017-06-25 ENCOUNTER — Ambulatory Visit (INDEPENDENT_AMBULATORY_CARE_PROVIDER_SITE_OTHER): Payer: BLUE CROSS/BLUE SHIELD | Admitting: Obstetrics and Gynecology

## 2017-06-25 ENCOUNTER — Encounter: Payer: Self-pay | Admitting: Obstetrics and Gynecology

## 2017-06-25 VITALS — BP 126/86 | HR 84 | Ht 61.0 in | Wt 166.5 lb

## 2017-06-25 DIAGNOSIS — Z1231 Encounter for screening mammogram for malignant neoplasm of breast: Secondary | ICD-10-CM

## 2017-06-25 DIAGNOSIS — Z124 Encounter for screening for malignant neoplasm of cervix: Secondary | ICD-10-CM | POA: Diagnosis not present

## 2017-06-25 DIAGNOSIS — Z01419 Encounter for gynecological examination (general) (routine) without abnormal findings: Secondary | ICD-10-CM | POA: Diagnosis not present

## 2017-06-25 DIAGNOSIS — Z1151 Encounter for screening for human papillomavirus (HPV): Secondary | ICD-10-CM

## 2017-06-25 MED ORDER — NORETHIN-ETH ESTRAD-FE BIPHAS 1 MG-10 MCG / 10 MCG PO TABS: 1.0000 | ORAL_TABLET | Freq: Every day | ORAL | 11 refills | Status: AC

## 2017-06-25 NOTE — Progress Notes (Signed)
Subjective:     Raven Ballard is a 41 y.o. female G3P3 with BMI 31 who is here for a comprehensive physical exam. The patient reports various symptoms including mood swings, hot flashes, vaginal dryness, irritability. She reports a monthly period lasting 3-4 days. She is sexually active using tubal ligation for contraception. She denies any urinary incontinence. She reports a normal TSH a few months ago with her PCP. She also suffers from interstitial cystitis which is currently well managed with medication. She denies any pelvic pain or abnormal discharge.  Past Medical History:  Diagnosis Date  . Hypothyroidism   . Normal pregnancy, repeat 10/22/2012  . S/P cesarean section 10/22/2012  . S/P tubal ligation 10/22/2012   Past Surgical History:  Procedure Laterality Date  . CESAREAN SECTION    . CESAREAN SECTION WITH BILATERAL TUBAL LIGATION  10/22/2012   Procedure: CESAREAN SECTION WITH BILATERAL TUBAL LIGATION;  Surgeon: Sherron Monday, MD;  Location: [facility];  Service: Obstetrics;  Laterality: Bilateral;  . DIAGNOSTIC LAPAROSCOPY     Family History  Problem Relation Age of Onset  . Hypertension Mother   . Thyroid disease Mother   . Diabetes Father     Social History   Social History  . Marital status: Married    Spouse name: N/A  . Number of children: N/A  . Years of education: N/A   Occupational History  . Not on file.   Social History Main Topics  . Smoking status: Former Smoker    Packs/day: 0.50    Types: Cigarettes  . Smokeless tobacco: Never Used  . Alcohol use 1.2 oz/week    2 Glasses of wine per week     Comment: socially   . Drug use: No  . Sexual activity: Yes    Birth control/ protection: Surgical     Comment: BTL   Other Topics Concern  . Not on file   Social History Narrative  . No narrative on file   Health Maintenance  Topic Date Due  . TETANUS/TDAP  08/23/1994  . PAP SMEAR  08/23/1996  . INFLUENZA VACCINE  06/06/2017  . HIV Screening   Completed       Review of Systems Pertinent items are noted in HPI.   Objective:  Blood pressure 126/86, pulse 84, height 5\' 1"  (1.549 m), weight 166 lb 8 oz (75.5 kg), last menstrual period 06/15/2017.     GENERAL: Well-developed, well-nourished female in no acute distress.  HEENT: Normocephalic, atraumatic. Sclerae anicteric.  NECK: Supple. Normal thyroid.  LUNGS: Clear to auscultation bilaterally.  HEART: Regular rate and rhythm. BREASTS: Symmetric in size. No palpable masses or lymphadenopathy, skin changes, or nipple drainage. ABDOMEN: Soft, nontender, nondistended. No organomegaly. PELVIC: Normal external female genitalia. Vagina is pink and rugated.  Normal discharge. Normal appearing cervix. Uterus is normal in size. No adnexal mass or tenderness. EXTREMITIES: No cyanosis, clubbing, or edema, 2+ distal pulses.    Assessment:    Healthy female exam.      Plan:    pap smear collected Discussed medical management with ocp for a short interval of time. Patient states this helped her in the past and desires to try again. Rx Lo estrin provided - Screening mammogram ordered - patient will be contacted with any abnormal results - RTC prn See After Visit Summary for Counseling Recommendations

## 2017-06-25 NOTE — Progress Notes (Signed)
Pt presents for annual and pap today. She declines STD testing. Pt c/o hot flashes, hand swelling, HA's, and mood swings before and after periods. Pt also c/o yeast infections before periods. Pt has never had a mgm.

## 2017-06-28 LAB — CYTOLOGY - PAP
DIAGNOSIS: NEGATIVE
HPV (WINDOPATH): NOT DETECTED

## 2017-07-05 ENCOUNTER — Ambulatory Visit
Admission: RE | Admit: 2017-07-05 | Discharge: 2017-07-05 | Disposition: A | Discharge status: Home / Self Care | Payer: BLUE CROSS/BLUE SHIELD | Source: Ambulatory Visit | Attending: Obstetrics and Gynecology | Admitting: Obstetrics and Gynecology

## 2017-07-05 DIAGNOSIS — Z1231 Encounter for screening mammogram for malignant neoplasm of breast: Secondary | ICD-10-CM

## 2017-07-10 ENCOUNTER — Other Ambulatory Visit: Payer: Self-pay | Admitting: Obstetrics and Gynecology

## 2017-07-10 DIAGNOSIS — R928 Other abnormal and inconclusive findings on diagnostic imaging of breast: Secondary | ICD-10-CM

## 2017-07-13 ENCOUNTER — Ambulatory Visit
Admission: RE | Admit: 2017-07-13 | Discharge: 2017-07-13 | Disposition: A | Discharge status: Home / Self Care | Payer: BLUE CROSS/BLUE SHIELD | Source: Ambulatory Visit | Attending: Obstetrics and Gynecology | Admitting: Obstetrics and Gynecology

## 2017-07-13 DIAGNOSIS — R928 Other abnormal and inconclusive findings on diagnostic imaging of breast: Secondary | ICD-10-CM

## 2019-02-25 ENCOUNTER — Other Ambulatory Visit: Payer: Self-pay | Admitting: Family Medicine

## 2019-02-25 DIAGNOSIS — Z1231 Encounter for screening mammogram for malignant neoplasm of breast: Secondary | ICD-10-CM

## 2019-03-17 ENCOUNTER — Ambulatory Visit
Admission: RE | Admit: 2019-03-17 | Discharge: 2019-03-17 | Disposition: A | Discharge status: Home / Self Care | Payer: BLUE CROSS/BLUE SHIELD | Source: Ambulatory Visit | Attending: Family Medicine | Admitting: Family Medicine

## 2019-03-17 ENCOUNTER — Other Ambulatory Visit: Payer: Self-pay

## 2019-03-17 DIAGNOSIS — Z1231 Encounter for screening mammogram for malignant neoplasm of breast: Secondary | ICD-10-CM

## 2019-03-20 ENCOUNTER — Other Ambulatory Visit: Payer: Self-pay | Admitting: Family Medicine

## 2019-03-20 DIAGNOSIS — R928 Other abnormal and inconclusive findings on diagnostic imaging of breast: Secondary | ICD-10-CM

## 2019-04-01 ENCOUNTER — Other Ambulatory Visit: Payer: BLUE CROSS/BLUE SHIELD

## 2019-04-08 ENCOUNTER — Ambulatory Visit: Payer: BLUE CROSS/BLUE SHIELD

## 2019-04-08 ENCOUNTER — Other Ambulatory Visit: Payer: Self-pay

## 2019-04-08 ENCOUNTER — Ambulatory Visit
Admission: RE | Admit: 2019-04-08 | Discharge: 2019-04-08 | Disposition: A | Discharge status: Home / Self Care | Payer: BLUE CROSS/BLUE SHIELD | Source: Ambulatory Visit | Attending: Family Medicine | Admitting: Family Medicine

## 2019-04-08 DIAGNOSIS — R928 Other abnormal and inconclusive findings on diagnostic imaging of breast: Secondary | ICD-10-CM

## 2019-09-02 IMAGING — MG DIGITAL DIAGNOSTIC UNILATERAL RIGHT MAMMOGRAM WITH TOMO AND CAD
4 series · 4 of 12 positions shown · non-contrast
Comparison: March 17, 2019 and July 05, 2017.

CLINICAL DATA: 42-year-old patient recalled from recent screening
mammogram for evaluation of a one view only asymmetry in the right
breast. This was seen in the CC projection.

EXAM:
DIGITAL DIAGNOSTIC UNILATERAL RIGHT MAMMOGRAM WITH CAD AND TOMO

[R ML synth-2D]
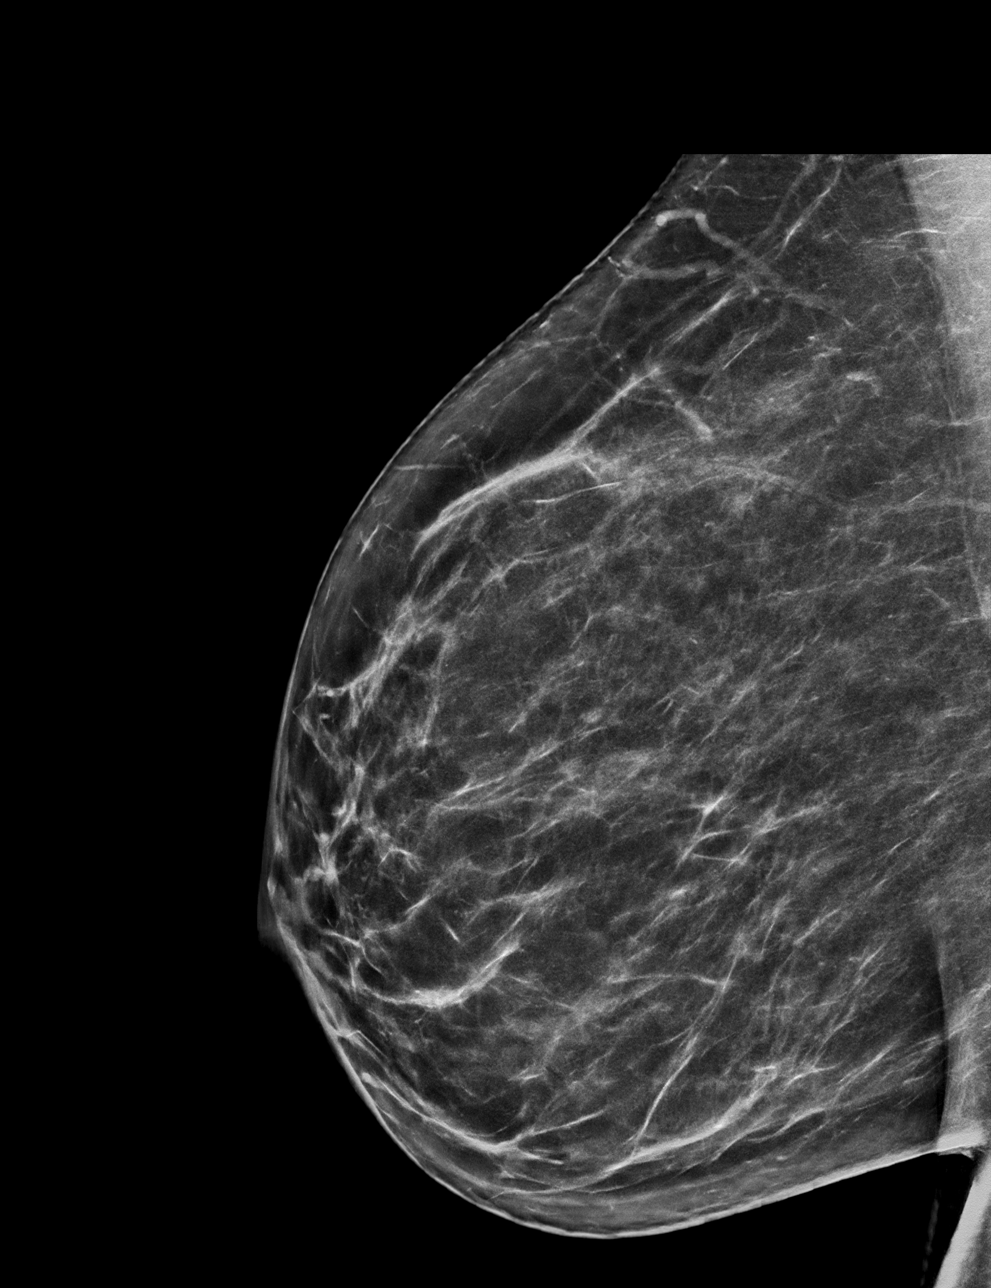

[R CC synth-2D]
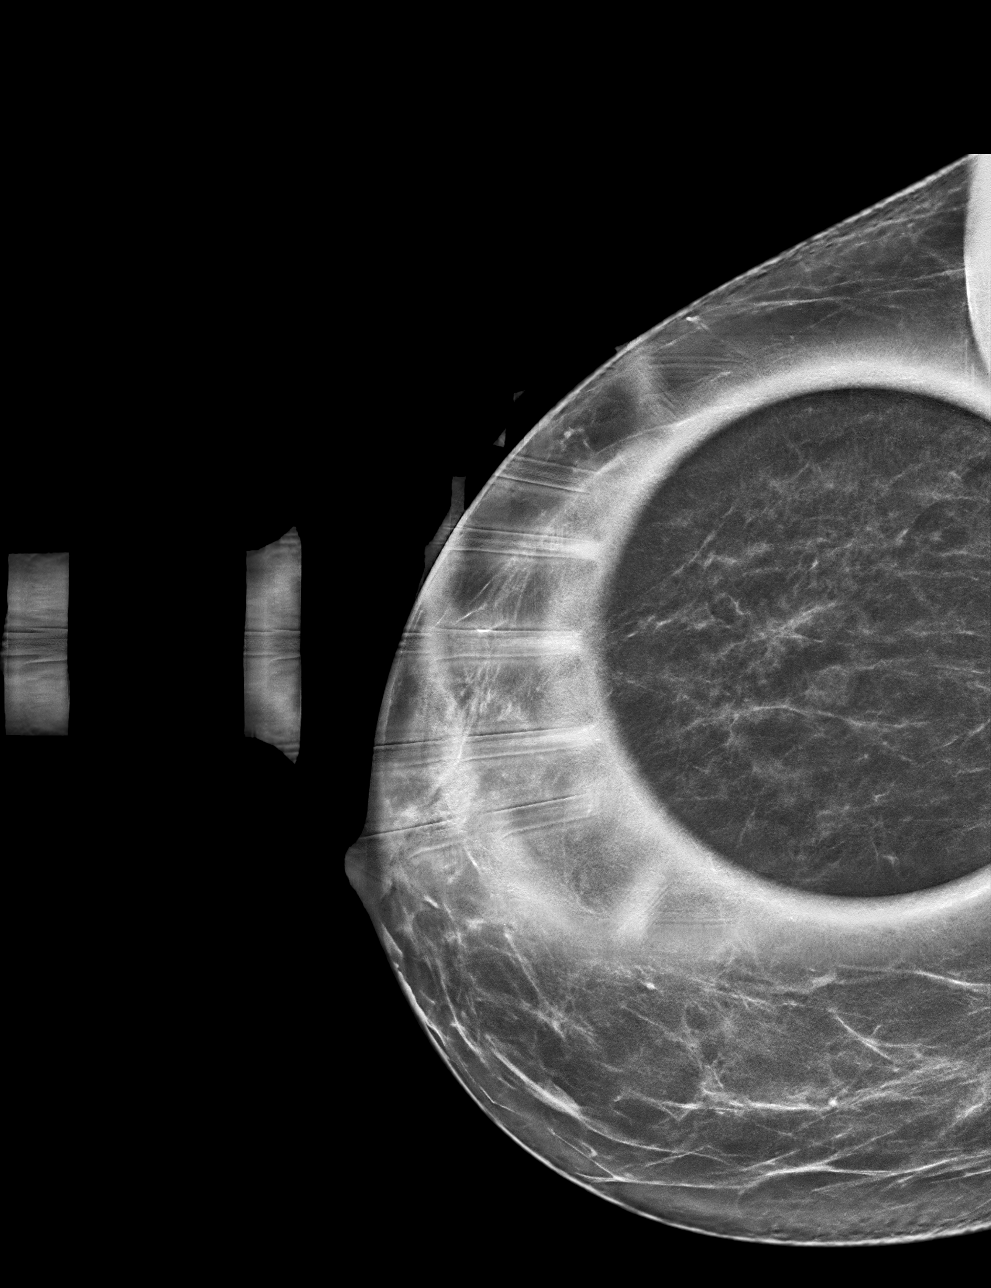

[R ML tomo · tomo slice 35/70.0]
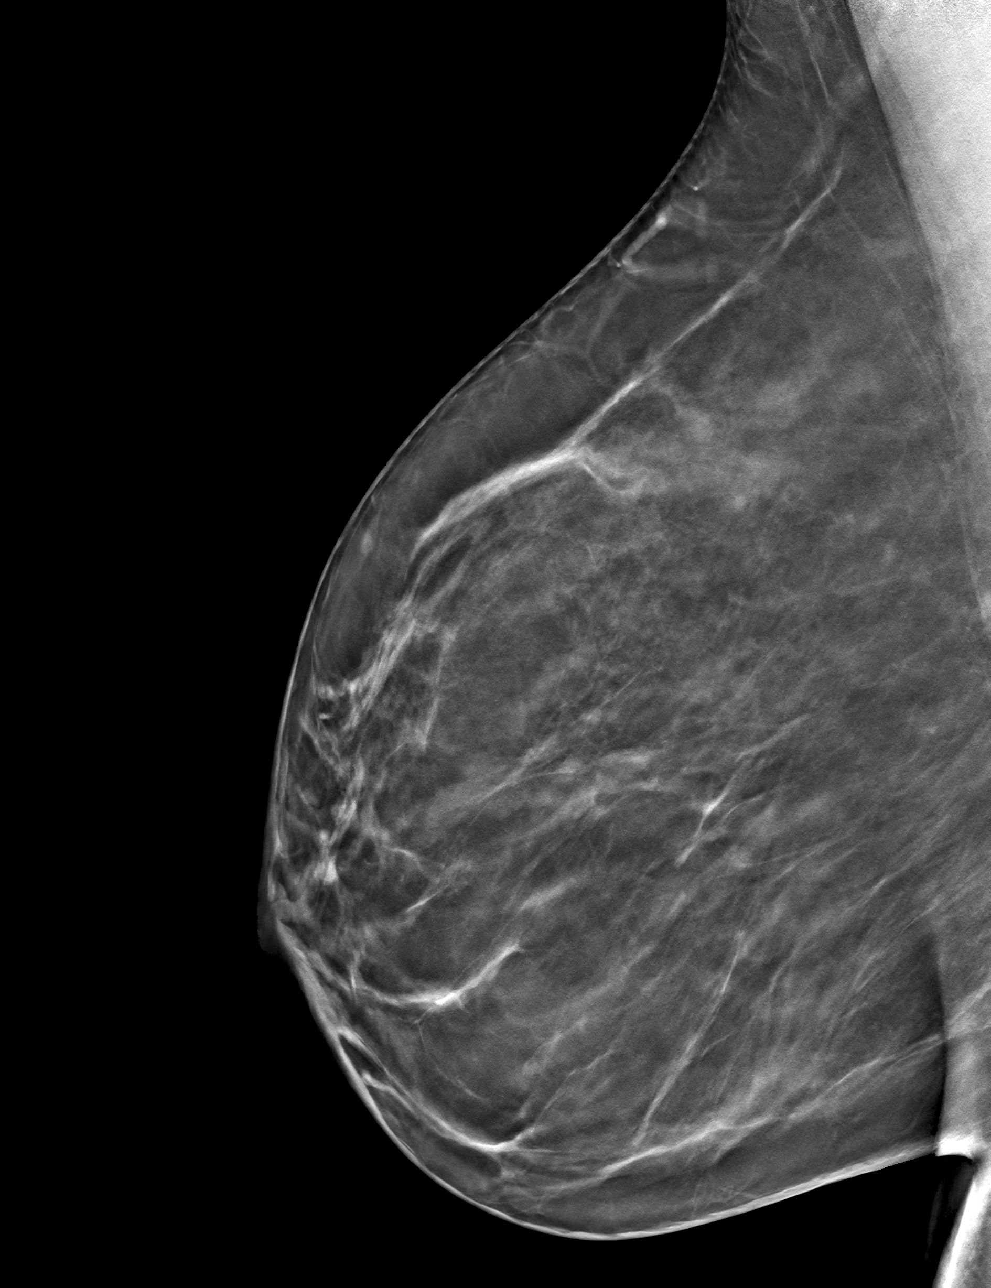

[R CC tomo · tomo slice 32/63.0]
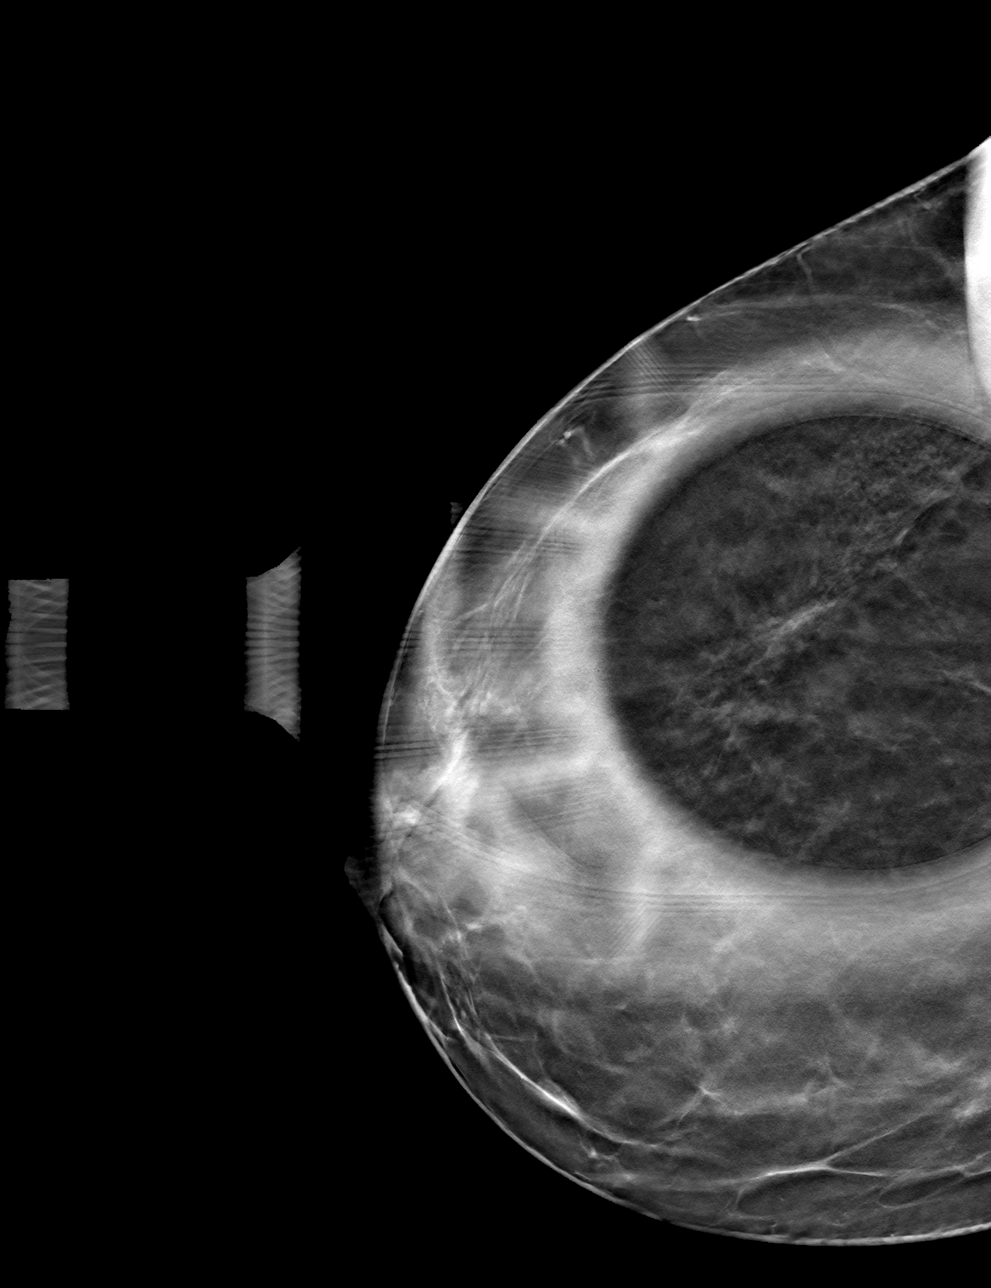

[4 of 12 positions shown; findings below may reference images not displayed]

ACR Breast Density Category b: There are scattered areas of
fibroglandular density.
FINDINGS: Spot compression view of the outer right breast with tomography is
negative. There is no persistent asymmetry or mass. A 90 degree
lateral view of the right breast with tomography is negative.

Mammographic images were processed with CAD.
IMPRESSION: No evidence of malignancy in the right breast.

RECOMMENDATION:
Screening mammogram in one year.(Code:8I-4-4NE)

I have discussed the findings and recommendations with the patient.
Results were also provided in writing at the conclusion of the
visit. If applicable, a reminder letter will be sent to the patient
regarding the next appointment.

BI-RADS CATEGORY  1: Negative.

## 2020-01-16 ENCOUNTER — Ambulatory Visit: Payer: BC Managed Care – PPO

## 2024-11-20 ENCOUNTER — Other Ambulatory Visit: Payer: Self-pay | Admitting: Obstetrics

## 2024-11-20 DIAGNOSIS — R928 Other abnormal and inconclusive findings on diagnostic imaging of breast: Secondary | ICD-10-CM

## 2024-11-24 ENCOUNTER — Ambulatory Visit
Admission: RE | Admit: 2024-11-24 | Discharge: 2024-11-24 | Disposition: A | Source: Ambulatory Visit | Attending: Obstetrics

## 2024-11-24 DIAGNOSIS — R928 Other abnormal and inconclusive findings on diagnostic imaging of breast: Secondary | ICD-10-CM
